# Patient Record
Sex: Female | Born: 1970 | Race: White | Hispanic: No | Marital: Single | Smoking: Former smoker
Health system: Southern US, Community
[De-identification: ages and names within clinical notes are randomized; demographics above are authoritative.]

## PROBLEM LIST (undated history)

## (undated) DIAGNOSIS — G8929 Other chronic pain: Secondary | ICD-10-CM

## (undated) DIAGNOSIS — M545 Low back pain: Secondary | ICD-10-CM

## (undated) HISTORY — DX: Low back pain: M54.5

## (undated) HISTORY — DX: Other chronic pain: G89.29

---

## 2002-12-03 ENCOUNTER — Emergency Department (HOSPITAL_COMMUNITY): Admission: EM | Admit: 2002-12-03 | Discharge: 2002-12-03 | Payer: Self-pay | Admitting: Emergency Medicine

## 2002-12-03 ENCOUNTER — Encounter: Payer: Self-pay | Admitting: Emergency Medicine

## 2003-05-07 ENCOUNTER — Ambulatory Visit (HOSPITAL_BASED_OUTPATIENT_CLINIC_OR_DEPARTMENT_OTHER): Admission: RE | Admit: 2003-05-07 | Discharge: 2003-05-08 | Payer: Self-pay | Admitting: Orthopedic Surgery

## 2003-09-29 ENCOUNTER — Ambulatory Visit (HOSPITAL_COMMUNITY): Admission: RE | Admit: 2003-09-29 | Discharge: 2003-09-29 | Payer: Self-pay | Admitting: General Surgery

## 2005-09-27 ENCOUNTER — Encounter: Admission: RE | Admit: 2005-09-27 | Discharge: 2005-09-27 | Payer: Self-pay | Admitting: General Surgery

## 2006-10-02 ENCOUNTER — Ambulatory Visit: Payer: Self-pay | Admitting: Family Medicine

## 2008-12-04 ENCOUNTER — Encounter: Admission: RE | Admit: 2008-12-04 | Discharge: 2008-12-04 | Payer: Self-pay | Admitting: Family Medicine

## 2010-11-27 ENCOUNTER — Encounter: Payer: Self-pay | Admitting: General Surgery

## 2011-03-24 NOTE — Op Note (Signed)
NAME:  Danielle Espinoza, Danielle Espinoza                        ACCOUNT NO.:  [id number]   MEDICAL RECORD NO.:  [id number]                   PATIENT TYPE:  AMB   LOCATION:  DSC                                  FACILITY:  [facility]   PHYSICIAN:  Nadara Mustard, M.D.                DATE OF BIRTH:  [birth year]   DATE OF PROCEDURE:  05/07/2003  DATE OF DISCHARGE:                                 OPERATIVE REPORT   PREOPERATIVE DIAGNOSIS:  Lateral right ankle laxity, status post failed  lateral ankle reconstruction.   PROCEDURE:  [name] modified Brostrom right lateral ankle reconstruction.   SURGEON:  Nadara Mustard, M.D.   ANESTHESIA:  General.   ESTIMATED BLOOD LOSS:  Minimal.   ANTIBIOTICS:  1 g Kefzol.   TOURNIQUET TIME:  Esmarch at the ankle for approximately 39 minutes.   DISPOSITION:  To PACU in stable condition, planned for 23-hour observation  and discharge by anesthesia in the morning.   INDICATION FOR PROCEDURE:  The patient is a 40 year old woman who had  bilateral Brostrom ankle reconstruction procedures in 2001.  The patient  states that the procedures were performed at [facility].  She has  had recurrent lateral ankle instability and unable to perform activities of  daily living due to her lateral ankle instability and presents at this time  for surgical stabilization.  The risks and benefits were discussed,  including infection, neurovascular injury, persistence of pain, recurrence  of the instability, need for additional surgery.  The patient states she  understands and wishes to proceed at this time.   DESCRIPTION OF PROCEDURE:  The patient was brought to OR room 6 and  underwent a general anesthetic.  After an adequate level of anesthesia  obtained, the patient's right lower extremity was prepped using Duraprep,  draped into a sterile field, and an [place] was used to cover all exposed  skin.  The patient received 1 g of Kefzol preoperatively.  An Esmarch was  applied to  the ankle for tourniquet control.  Her previous surgical  incision, which was a U-type incision around the distal aspect of the  fibula, was used for the surgical incision.  This was carried sharply down  to the extensor retinaculum, which was cut and retracted and would be used  latera to reinforce the repair.  Her anterior talofibular ligament  reconstruction was taken down.  There was a significant amount of laxity,  and there was Ethibond suture within the anterior talofibular ligament  reconstruction.  Examination of the calcaneofibular ligament showed a laxity  of the calcaneofibular ligament and showed no evidence of any  calcaneofibular ligament imbrication.  Attention was first focused at the  calcaneofibular ligament.  Approximately 3 mm of this ligament was resected  and 2-0 Ethibond was secured in the proximal and distal aspects of the  calcaneofibular ligament stump ends.  The anterior talofibular ligament was  also  debrided of approximately 4 mm of redundancy and 2-0 Ethibond was  placed in both the proximal and distal aspects of the anterior talofibular  ligament.  The foot was then held in slight dorsiflexion with eversion and  the ligament reconstructions were secured with the 2-0 Ethibond.  This was  then stressed, and this did show a stable lateral ankle reconstruction.  The  extensor retinaculum was then used with a 2-0 Ethibond to reinforce the  repair.  Again ankle stress showed this to provide a stable reconstruction.  The skin was closed using Proximate staples.  The wound was covered with  Adaptic, orthopedic sponges, and a compressive Quincy Simmonds dressing was  applied with the ankle in slight dorsiflexion with eversion.  The patient  was extubated and taken to PACU in stable condition.  Total Esmarch time 39  minutes.  Planned for 23-hour observation with discharge by anesthesia in  the morning.                                               Nadara Mustard,  M.D.    MVD/MEDQ  D:  05/07/2003  T:  05/08/2003  Job:  [id number] [id number]

## 2013-07-14 ENCOUNTER — Other Ambulatory Visit (HOSPITAL_COMMUNITY): Payer: Self-pay | Admitting: Internal Medicine

## 2013-07-14 DIAGNOSIS — Z139 Encounter for screening, unspecified: Secondary | ICD-10-CM

## 2013-07-17 ENCOUNTER — Ambulatory Visit (HOSPITAL_COMMUNITY): Payer: Self-pay

## 2013-07-22 ENCOUNTER — Ambulatory Visit (HOSPITAL_COMMUNITY): Payer: Self-pay

## 2013-07-25 ENCOUNTER — Ambulatory Visit (HOSPITAL_COMMUNITY)
Admission: RE | Admit: 2013-07-25 | Discharge: 2013-07-25 | Disposition: A | Discharge status: Home / Self Care | Payer: Medicaid Other | Source: Ambulatory Visit | Attending: Internal Medicine | Admitting: Internal Medicine

## 2013-07-25 DIAGNOSIS — Z139 Encounter for screening, unspecified: Secondary | ICD-10-CM

## 2013-07-25 DIAGNOSIS — Z1231 Encounter for screening mammogram for malignant neoplasm of breast: Secondary | ICD-10-CM | POA: Insufficient documentation

## 2013-12-03 ENCOUNTER — Other Ambulatory Visit (HOSPITAL_COMMUNITY): Payer: Self-pay | Admitting: Internal Medicine

## 2013-12-03 DIAGNOSIS — M549 Dorsalgia, unspecified: Secondary | ICD-10-CM

## 2013-12-05 ENCOUNTER — Ambulatory Visit (HOSPITAL_COMMUNITY)
Admission: RE | Admit: 2013-12-05 | Discharge: 2013-12-05 | Disposition: A | Discharge status: Home / Self Care | Payer: Medicaid Other | Source: Ambulatory Visit | Attending: Internal Medicine | Admitting: Internal Medicine

## 2013-12-05 DIAGNOSIS — M47817 Spondylosis without myelopathy or radiculopathy, lumbosacral region: Secondary | ICD-10-CM | POA: Insufficient documentation

## 2013-12-05 DIAGNOSIS — R209 Unspecified disturbances of skin sensation: Secondary | ICD-10-CM | POA: Insufficient documentation

## 2013-12-05 DIAGNOSIS — M6281 Muscle weakness (generalized): Secondary | ICD-10-CM | POA: Insufficient documentation

## 2013-12-05 DIAGNOSIS — M549 Dorsalgia, unspecified: Secondary | ICD-10-CM | POA: Insufficient documentation

## 2013-12-05 DIAGNOSIS — M79609 Pain in unspecified limb: Secondary | ICD-10-CM | POA: Insufficient documentation

## 2014-11-05 ENCOUNTER — Telehealth: Payer: Self-pay | Admitting: Neurology

## 2014-11-05 ENCOUNTER — Encounter: Payer: Medicaid Other | Admitting: Neurology

## 2014-11-05 NOTE — Telephone Encounter (Signed)
This patient canceled an EMG appointment within an hour of the appointment date.

## 2014-11-20 ENCOUNTER — Ambulatory Visit
Admission: RE | Admit: 2014-11-20 | Discharge: 2014-11-20 | Disposition: A | Discharge status: Home / Self Care | Payer: Medicaid Other | Source: Ambulatory Visit | Attending: Physical Medicine and Rehabilitation | Admitting: Physical Medicine and Rehabilitation

## 2014-11-20 ENCOUNTER — Other Ambulatory Visit: Payer: Self-pay | Admitting: Physical Medicine and Rehabilitation

## 2014-11-20 DIAGNOSIS — M25561 Pain in right knee: Secondary | ICD-10-CM

## 2014-11-20 DIAGNOSIS — M25562 Pain in left knee: Principal | ICD-10-CM

## 2014-12-01 ENCOUNTER — Encounter: Payer: Medicaid Other | Admitting: Neurology

## 2014-12-04 ENCOUNTER — Ambulatory Visit
Admission: RE | Admit: 2014-12-04 | Discharge: 2014-12-04 | Disposition: A | Discharge status: Home / Self Care | Payer: Medicaid Other | Source: Ambulatory Visit | Attending: Physical Medicine and Rehabilitation | Admitting: Physical Medicine and Rehabilitation

## 2014-12-04 ENCOUNTER — Other Ambulatory Visit: Payer: Self-pay | Admitting: Physical Medicine and Rehabilitation

## 2014-12-04 DIAGNOSIS — M25572 Pain in left ankle and joints of left foot: Secondary | ICD-10-CM

## 2014-12-04 DIAGNOSIS — M25571 Pain in right ankle and joints of right foot: Secondary | ICD-10-CM

## 2014-12-08 ENCOUNTER — Encounter: Payer: Medicaid Other | Admitting: Neurology

## 2014-12-14 ENCOUNTER — Encounter: Payer: Self-pay | Admitting: Neurology

## 2014-12-14 ENCOUNTER — Ambulatory Visit (INDEPENDENT_AMBULATORY_CARE_PROVIDER_SITE_OTHER): Payer: Medicaid Other | Admitting: Neurology

## 2014-12-14 ENCOUNTER — Ambulatory Visit (INDEPENDENT_AMBULATORY_CARE_PROVIDER_SITE_OTHER): Payer: Self-pay | Admitting: Neurology

## 2014-12-14 DIAGNOSIS — M5442 Lumbago with sciatica, left side: Secondary | ICD-10-CM

## 2014-12-14 DIAGNOSIS — G8929 Other chronic pain: Secondary | ICD-10-CM

## 2014-12-14 DIAGNOSIS — M5441 Lumbago with sciatica, right side: Secondary | ICD-10-CM

## 2014-12-14 DIAGNOSIS — M545 Low back pain, unspecified: Secondary | ICD-10-CM | POA: Insufficient documentation

## 2014-12-14 DIAGNOSIS — M79604 Pain in right leg: Secondary | ICD-10-CM

## 2014-12-14 HISTORY — DX: Other chronic pain: G89.29

## 2014-12-14 HISTORY — DX: Low back pain, unspecified: M54.50

## 2014-12-14 NOTE — Procedures (Signed)
     HISTORY:   Danielle Espinoza is a 44 year old patient with marked obesity and a history of chronic low back pain , and bilateral leg discomfort, right greater than left. The patient reports pain going all down the leg to the foot. She is being evaluated for a possible lumbar radiculopathy.  NERVE CONDUCTION STUDIES:  Nerve conduction studies were performed on both lower extremities. The distal motor latencies and motor amplitudes for the peroneal and posterior tibial nerves were within normal limits. The nerve conduction velocities for these nerves were also normal. The H reflex latencies were normal. The sensory latencies for the peroneal nerves were within normal limits. The medial and lateral plantar sensory latencies were within normal limits bilaterally.   EMG STUDIES:  EMG study was performed on the right lower extremity:  The tibialis anterior muscle reveals 2 to 4K motor units with full recruitment. No fibrillations or positive waves were seen. The peroneus tertius muscle reveals 2 to 4K motor units with full recruitment. No fibrillations or positive waves were seen. The medial gastrocnemius muscle reveals 1 to 3K motor units with full recruitment. No fibrillations or positive waves were seen. The vastus lateralis muscle reveals 2 to 4K motor units with full recruitment. No fibrillations or positive waves were seen. The iliopsoas muscle reveals 2 to 4K motor units with full recruitment. No fibrillations or positive waves were seen. The biceps femoris muscle (long head) reveals 2 to 4K motor units with full recruitment. No fibrillations or positive waves were seen. The lumbosacral paraspinal muscles were tested at 3 levels, and revealed no abnormalities of insertional activity at all 3 levels tested. There was good relaxation.  EMG study was performed on the left lower extremity:  The tibialis anterior muscle reveals 2 to 4K motor units with full recruitment. No fibrillations or  positive waves were seen. The peroneus tertius muscle reveals 2 to 4K motor units with full recruitment. No fibrillations or positive waves were seen. The medial gastrocnemius muscle reveals 1 to 3K motor units with full recruitment. No fibrillations or positive waves were seen. The vastus lateralis muscle reveals 2 to 4K motor units with full recruitment. No fibrillations or positive waves were seen. The iliopsoas muscle reveals 2 to 4K motor units with full recruitment. No fibrillations or positive waves were seen. The biceps femoris muscle (long head) reveals 2 to 4K motor units with full recruitment. No fibrillations or positive waves were seen. The lumbosacral paraspinal muscles were tested at 3 levels, and revealed no abnormalities of insertional activity at all 3 levels tested. There was good relaxation.   IMPRESSION:  Nerve conduction studies done on both lower extremities were unremarkable, without evidence of a peripheral neuropathy. EMG evaluation of both lower extremities were unremarkable, without evidence of a lumbosacral radiculopathy on either side.  Marlan Palau. Keith Dresean Beckel MD 12/14/2014 4:52 PM  [facility] [address] [place], [place] [id number]  Phone [phone] Fax [phone]

## 2014-12-14 NOTE — Progress Notes (Signed)
Please refer to EMG and nerve conduction study procedure note. 

## 2015-07-13 ENCOUNTER — Other Ambulatory Visit (HOSPITAL_COMMUNITY): Payer: Self-pay | Admitting: Internal Medicine

## 2015-07-13 DIAGNOSIS — Z1231 Encounter for screening mammogram for malignant neoplasm of breast: Secondary | ICD-10-CM

## 2015-07-21 ENCOUNTER — Ambulatory Visit (HOSPITAL_COMMUNITY): Payer: Medicaid Other

## 2015-07-26 ENCOUNTER — Ambulatory Visit (HOSPITAL_COMMUNITY): Payer: Medicaid Other

## 2015-09-15 ENCOUNTER — Ambulatory Visit (HOSPITAL_COMMUNITY)
Admission: RE | Admit: 2015-09-15 | Discharge: 2015-09-15 | Disposition: A | Discharge status: Home / Self Care | Payer: Medicaid Other | Source: Ambulatory Visit | Attending: Internal Medicine | Admitting: Internal Medicine

## 2015-09-15 DIAGNOSIS — Z1231 Encounter for screening mammogram for malignant neoplasm of breast: Secondary | ICD-10-CM | POA: Insufficient documentation

## 2015-09-16 ENCOUNTER — Ambulatory Visit (HOSPITAL_COMMUNITY): Payer: Medicaid Other

## 2015-10-18 IMAGING — CR DG ANKLE COMPLETE 3+V*R*
3 series · 3 of 3 positions shown · non-contrast
Comparison: 02/15/2008

CLINICAL DATA: Chronic right ankle pain

EXAM:
RIGHT ANKLE - COMPLETE 3+ VIEW

[view not recorded (1 of 3)]
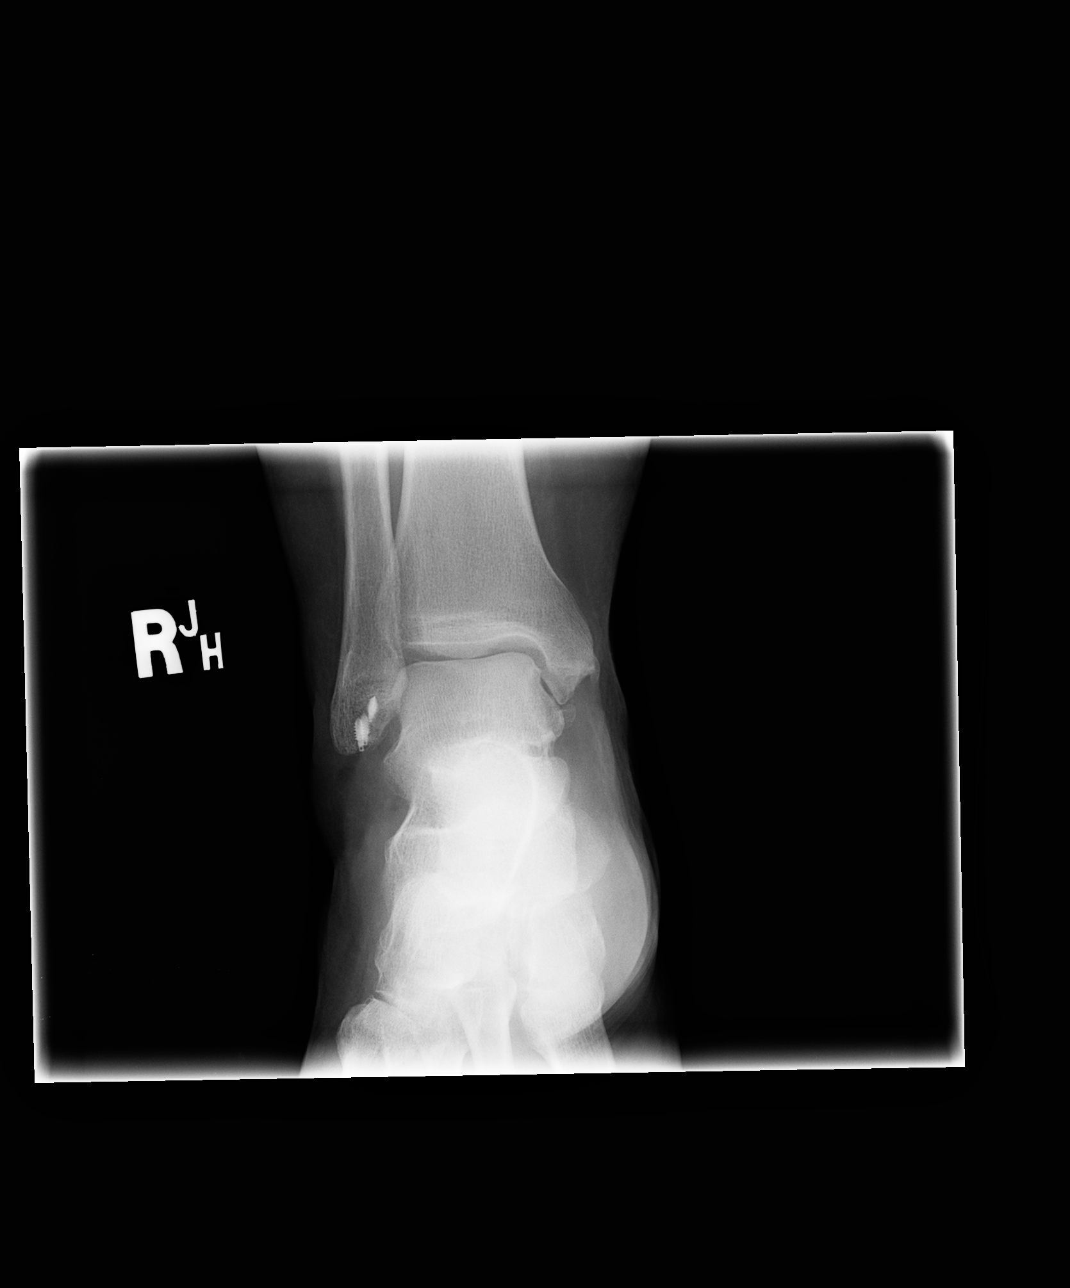

[view not recorded (2 of 3)]
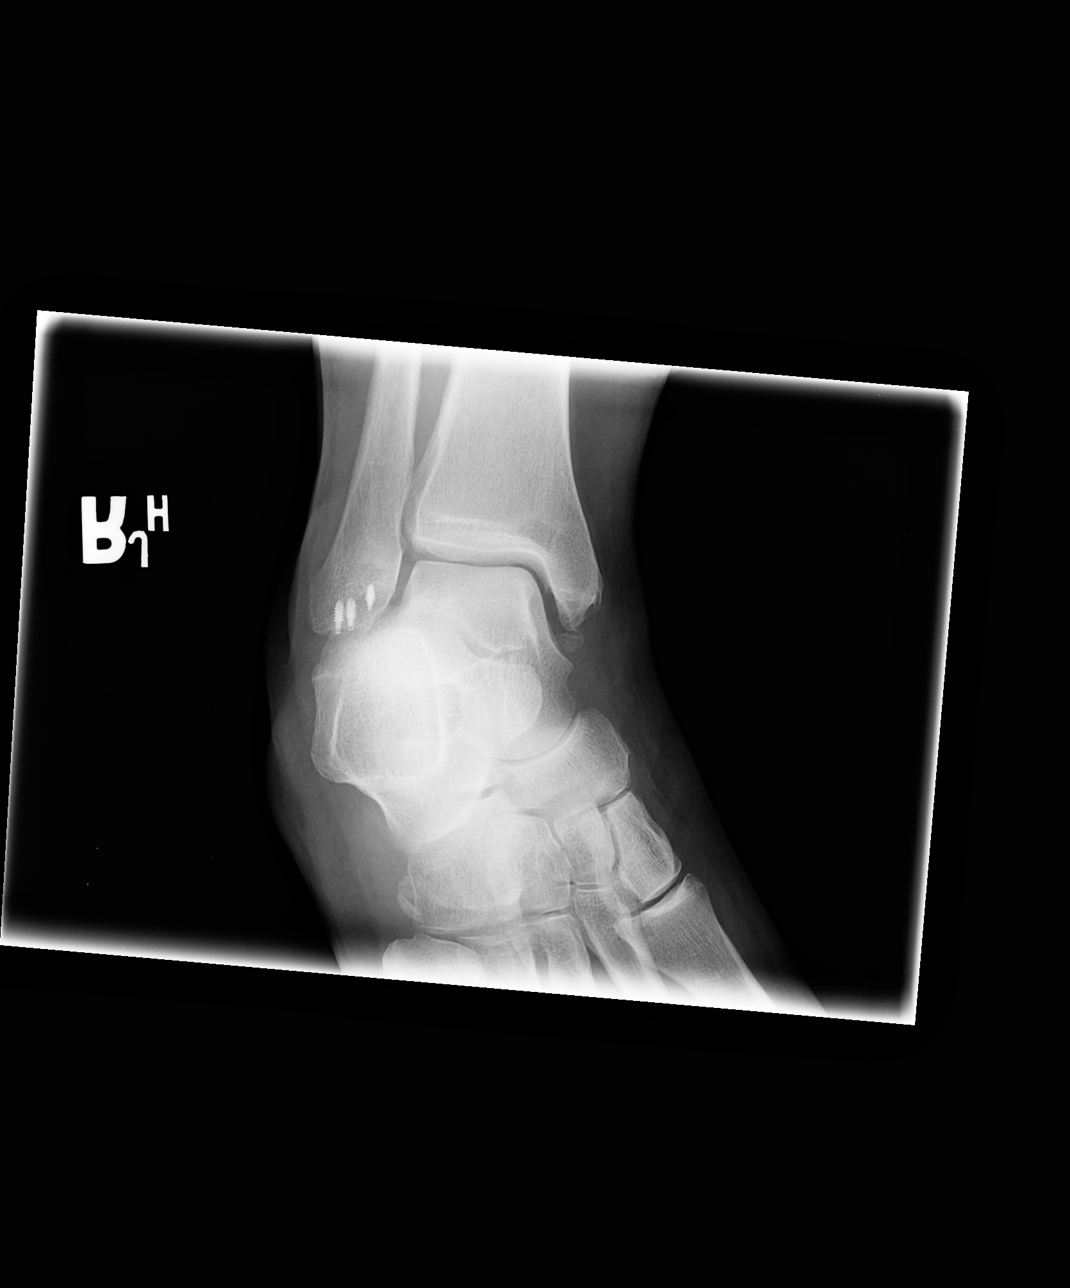

[view not recorded (3 of 3)]
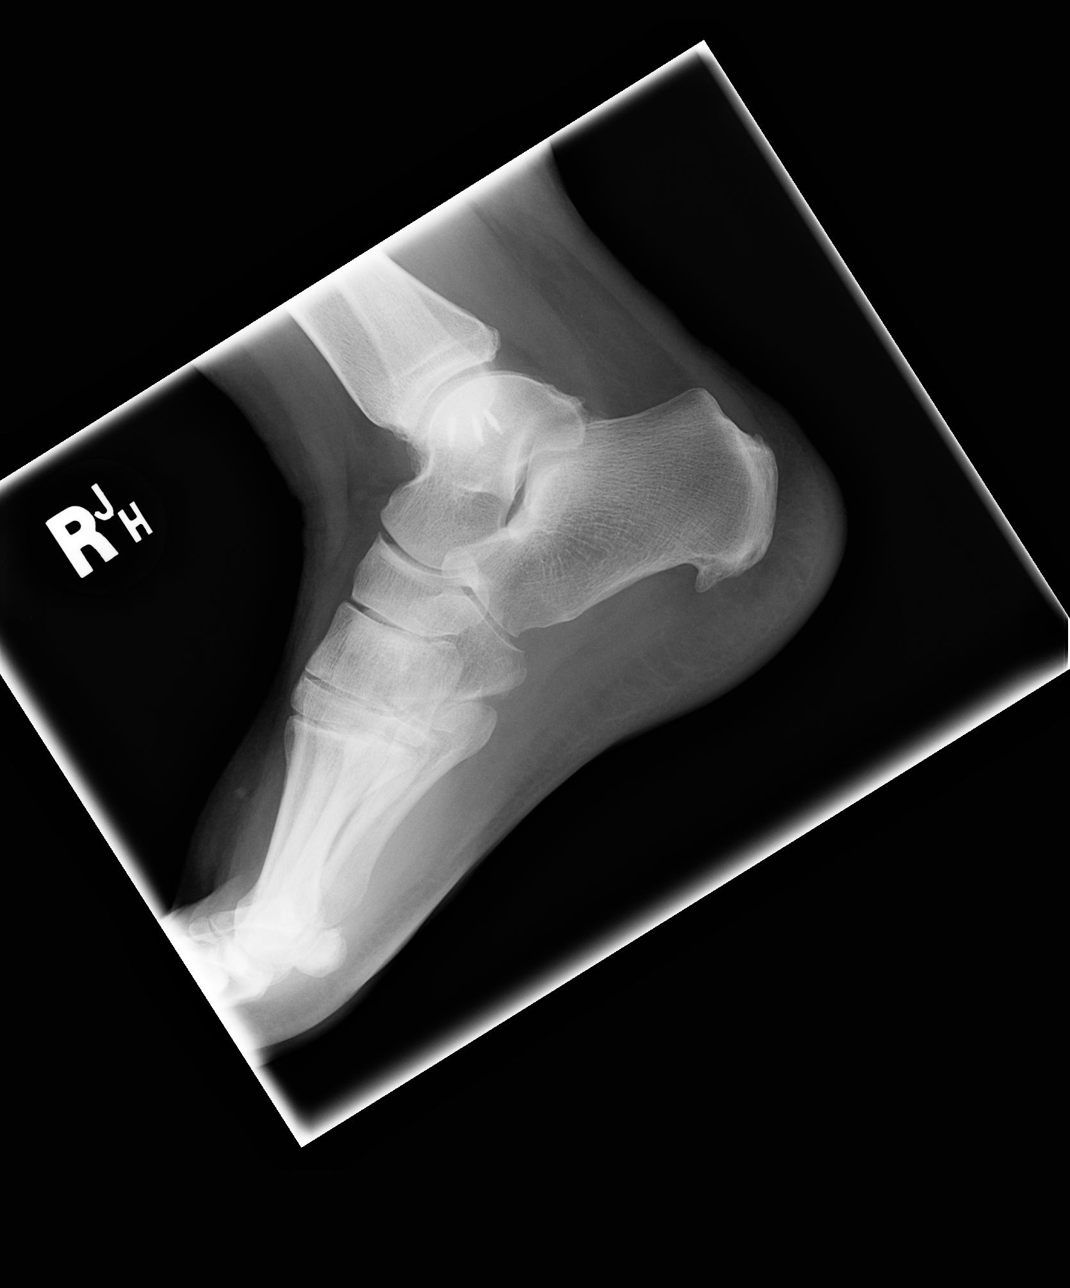

[3 of 3 positions shown; findings below may reference images not displayed]

FINDINGS: Three small metallic anchors noted of the right lateral malleolus.
Normal alignment without acute fracture. Preserved joint spaces.
Small unfused ossicle or remote avulsion noted of the medial
malleolus. Talus and calcaneus unremarkable. Small calcaneal spur
noted. Mild soft tissue swelling.
IMPRESSION: No acute osseous finding or malalignment.

Chronic postoperative findings of the lateral malleolus.

Calcaneal spur.

## 2018-12-05 ENCOUNTER — Other Ambulatory Visit (HOSPITAL_COMMUNITY): Payer: Self-pay | Admitting: Internal Medicine

## 2018-12-05 DIAGNOSIS — Z1231 Encounter for screening mammogram for malignant neoplasm of breast: Secondary | ICD-10-CM

## 2019-05-14 ENCOUNTER — Ambulatory Visit (HOSPITAL_COMMUNITY): Payer: Medicaid Other

## 2020-05-21 ENCOUNTER — Other Ambulatory Visit (HOSPITAL_COMMUNITY): Payer: Self-pay | Admitting: Internal Medicine

## 2020-05-21 DIAGNOSIS — Z1231 Encounter for screening mammogram for malignant neoplasm of breast: Secondary | ICD-10-CM

## 2020-06-10 ENCOUNTER — Ambulatory Visit (HOSPITAL_COMMUNITY): Payer: Medicaid Other

## 2020-06-17 ENCOUNTER — Ambulatory Visit (HOSPITAL_COMMUNITY): Payer: Medicaid Other

## 2021-08-11 ENCOUNTER — Encounter: Payer: Self-pay | Admitting: Internal Medicine

## 2022-01-05 ENCOUNTER — Ambulatory Visit: Payer: Medicaid Other | Admitting: Gastroenterology

## 2022-01-05 ENCOUNTER — Encounter: Payer: Self-pay | Admitting: Internal Medicine

## 2022-01-19 ENCOUNTER — Other Ambulatory Visit (HOSPITAL_COMMUNITY): Payer: Self-pay | Admitting: Internal Medicine

## 2023-02-09 ENCOUNTER — Other Ambulatory Visit (HOSPITAL_COMMUNITY): Payer: Self-pay | Admitting: Internal Medicine

## 2023-02-09 ENCOUNTER — Ambulatory Visit (HOSPITAL_COMMUNITY)
Admission: RE | Admit: 2023-02-09 | Discharge: 2023-02-09 | Disposition: A | Discharge status: Home / Self Care | Payer: Medicaid Other | Source: Ambulatory Visit | Attending: Internal Medicine | Admitting: Internal Medicine

## 2023-02-09 DIAGNOSIS — M549 Dorsalgia, unspecified: Secondary | ICD-10-CM

## 2024-08-12 ENCOUNTER — Ambulatory Visit: Admitting: Gastroenterology
